# Patient Record
Sex: Female | Born: 1950 | Race: Black or African American | Hispanic: No | Marital: Single | Smoking: Current every day smoker
Health system: Southern US, Community
[De-identification: ages and names within clinical notes are randomized; demographics above are authoritative.]

## PROBLEM LIST (undated history)

## (undated) DIAGNOSIS — I10 Essential (primary) hypertension: Secondary | ICD-10-CM

## (undated) DIAGNOSIS — J45909 Unspecified asthma, uncomplicated: Secondary | ICD-10-CM

## (undated) DIAGNOSIS — E785 Hyperlipidemia, unspecified: Secondary | ICD-10-CM

## (undated) DIAGNOSIS — M79604 Pain in right leg: Secondary | ICD-10-CM

## (undated) DIAGNOSIS — M549 Dorsalgia, unspecified: Secondary | ICD-10-CM

## (undated) DIAGNOSIS — E119 Type 2 diabetes mellitus without complications: Secondary | ICD-10-CM

## (undated) DIAGNOSIS — E559 Vitamin D deficiency, unspecified: Secondary | ICD-10-CM

## (undated) DIAGNOSIS — G629 Polyneuropathy, unspecified: Secondary | ICD-10-CM

## (undated) HISTORY — DX: Vitamin D deficiency, unspecified: E55.9

## (undated) HISTORY — DX: Hyperlipidemia, unspecified: E78.5

## (undated) HISTORY — DX: Polyneuropathy, unspecified: G62.9

## (undated) HISTORY — DX: Pain in right leg: M79.604

## (undated) HISTORY — PX: TONSILECTOMY, ADENOIDECTOMY, BILATERAL MYRINGOTOMY AND TUBES: SHX2538

## (undated) HISTORY — DX: Unspecified asthma, uncomplicated: J45.909

## (undated) HISTORY — DX: Type 2 diabetes mellitus without complications: E11.9

## (undated) HISTORY — PX: TUBAL LIGATION: SHX77

## (undated) HISTORY — DX: Essential (primary) hypertension: I10

## (undated) HISTORY — DX: Dorsalgia, unspecified: M54.9

---

## 2008-02-01 ENCOUNTER — Ambulatory Visit: Payer: Self-pay | Admitting: Internal Medicine

## 2008-05-16 ENCOUNTER — Ambulatory Visit: Payer: Self-pay | Admitting: Gastroenterology

## 2009-11-05 IMAGING — US ABDOMEN ULTRASOUND
1 series · 17 of 25 positions shown · non-contrast
Comparison: none

REASON FOR EXAM: Abdominal distention, pain
COMMENTS:

[Series 1: abdomen ultrasound · 17 of 40 slices shown]
[im 1/40]
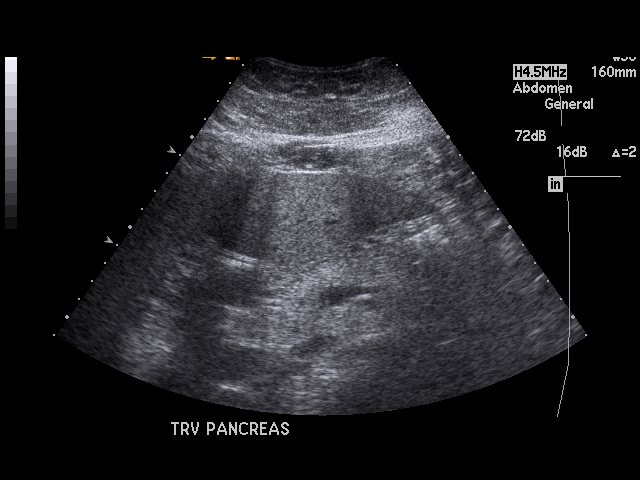
[im 4/40]
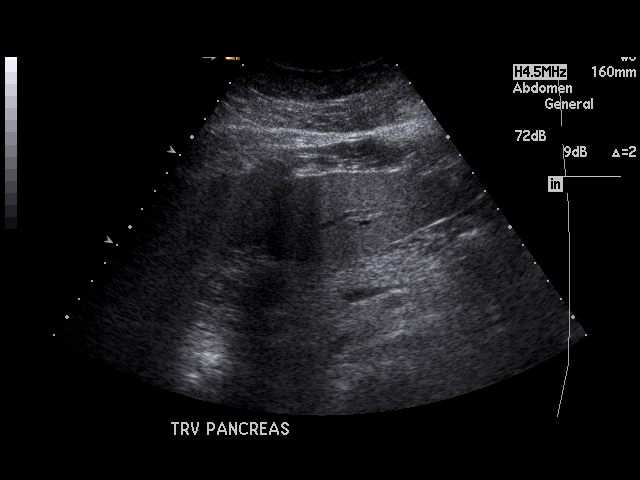
[im 5/40]
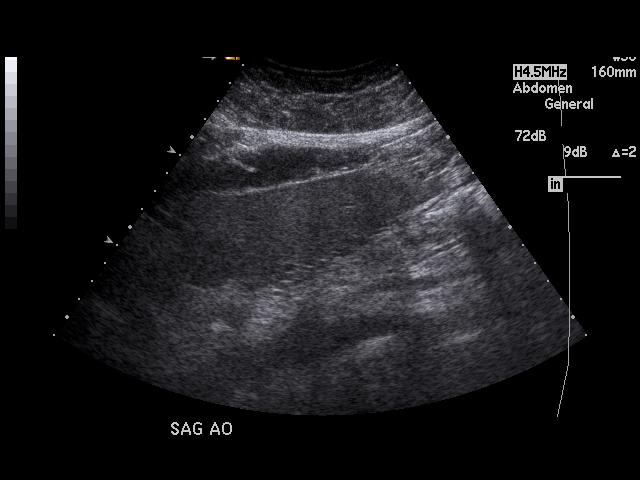
[im 9/40]
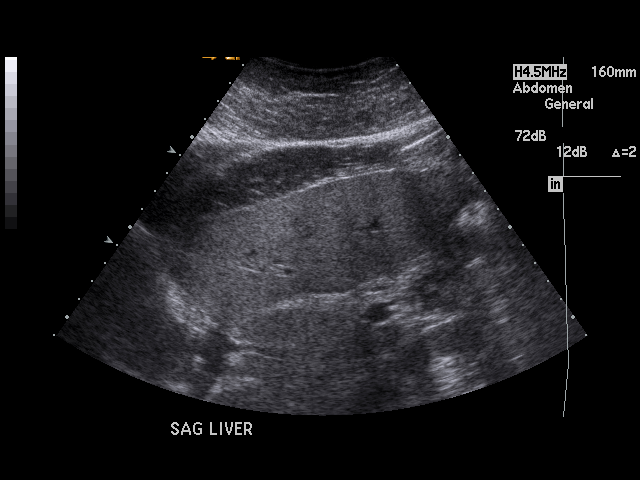
[im 10/40]
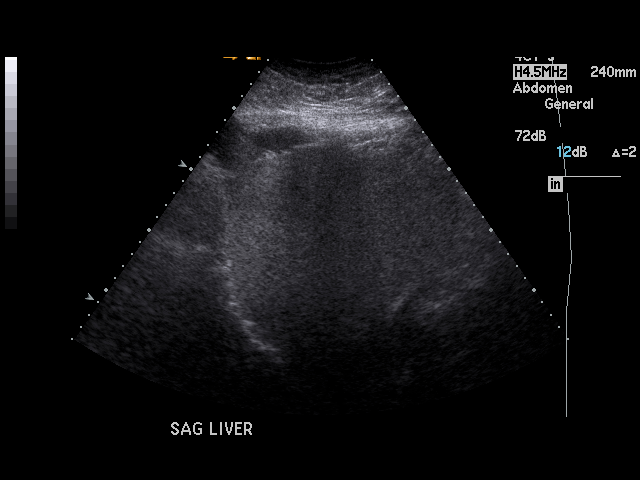
[im 14/40]
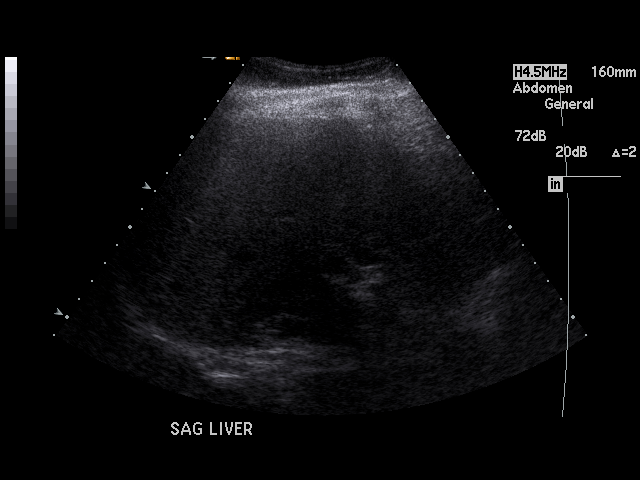
[im 15/40]
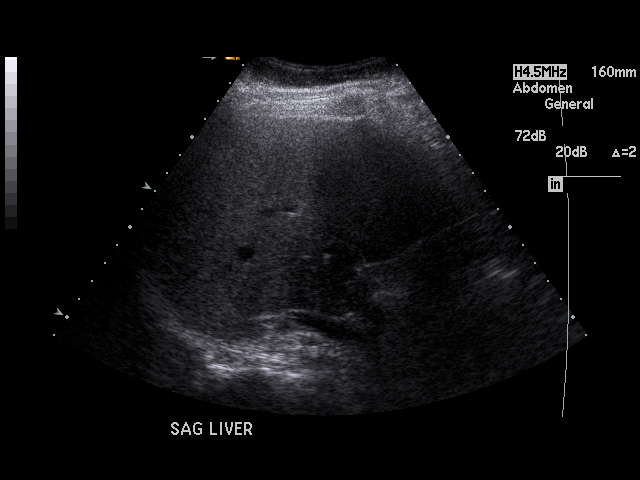
[im 18/40]
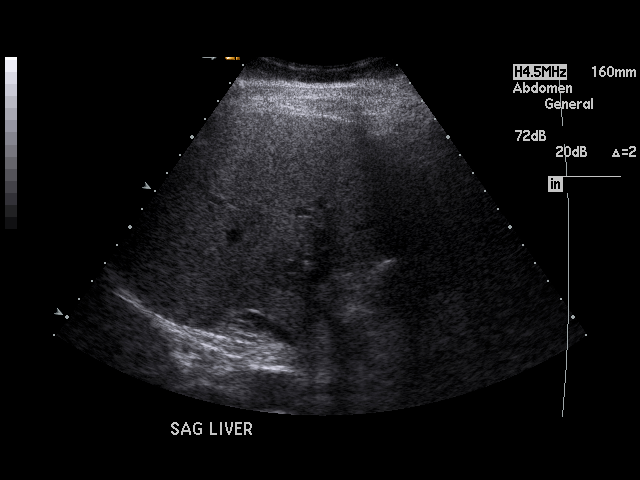
[im 20/40]
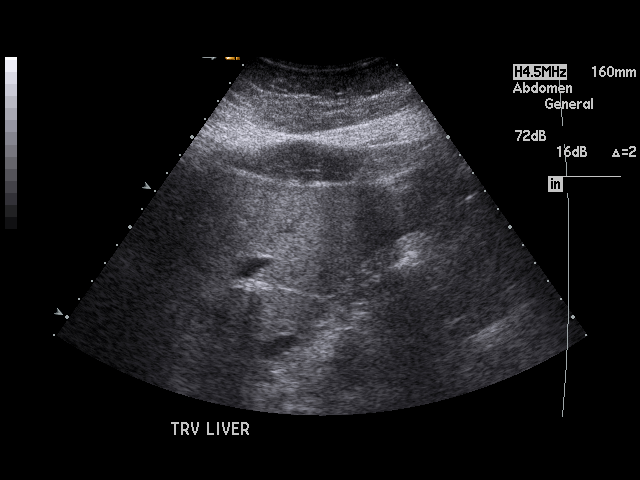
[im 22/40]
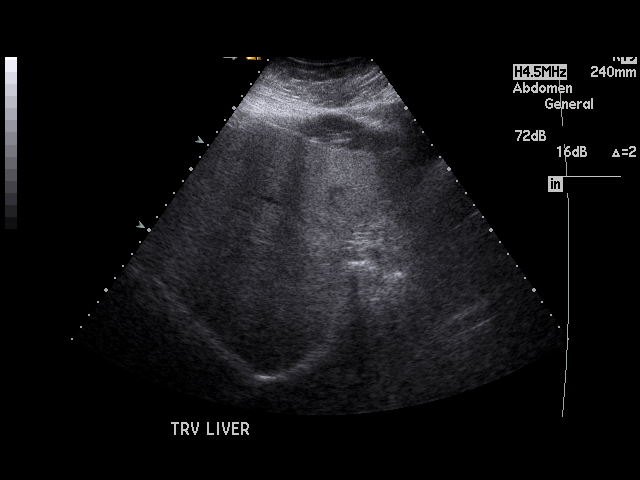
[im 25/40]
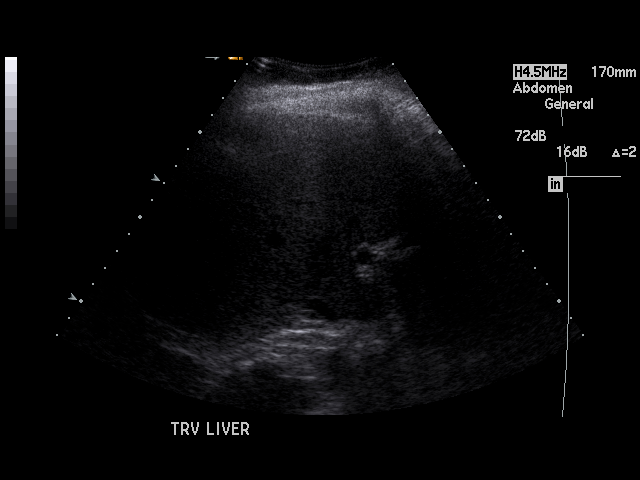
[im 27/40]
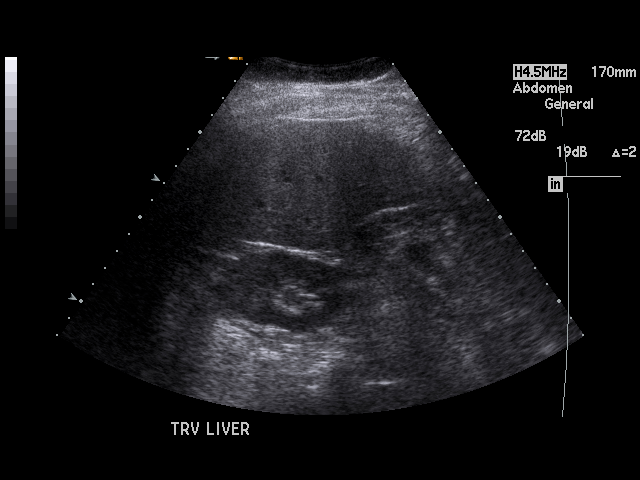
[im 30/40]
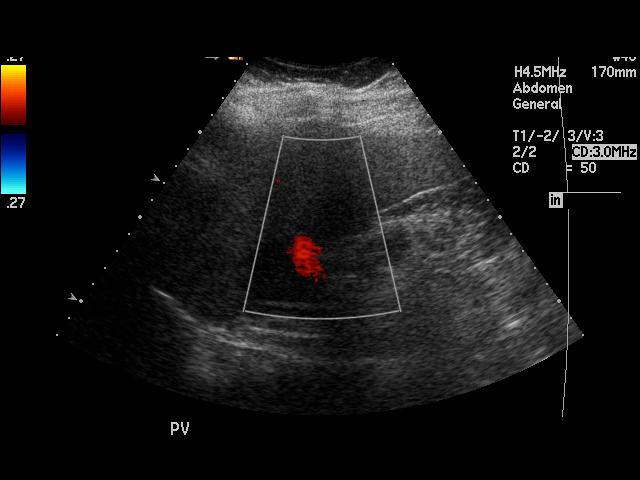
[im 31/40]
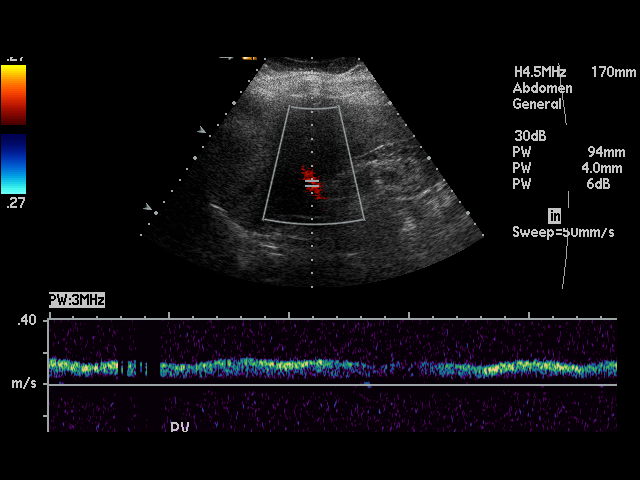
[im 35/40]
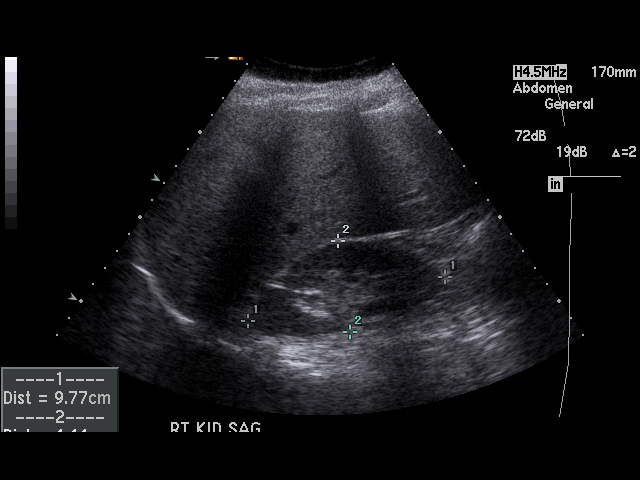
[im 36/40]
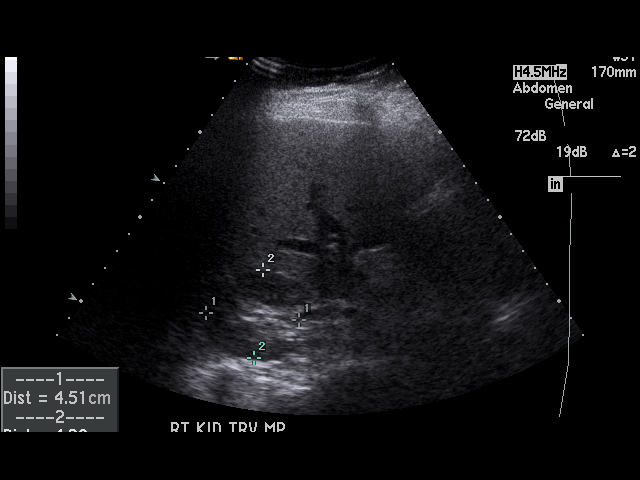
[im 40/40]
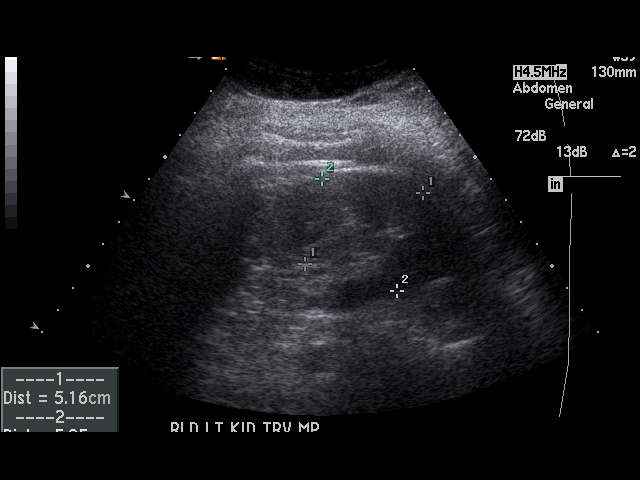

[17 of 25 positions shown; findings below may reference images not displayed]

PROCEDURE:     US  - US ABDOMEN GENERAL SURVEY  - February 01, 2008  [DATE]

RESULT:     The liver, spleen, abdominal aorta and inferior vena cava show
no significant abnormalities. The body of the pancreas is visualized and is
normal in appearance but the head and tail are partially obscured. The
patient is status post cholecystectomy. The common bile duct measures 6.2 mm
in diameter which is within normal limits. The kidneys show no
hydronephrosis. There is no ascites.
IMPRESSION: 1.  No acute changes are identified.
2.  The patient is status post cholecystectomy.
3.  The pancreas is partially obscured on this exam.
4.  There is no ascites.

## 2011-03-16 ENCOUNTER — Ambulatory Visit: Payer: Self-pay | Admitting: General Surgery

## 2011-03-18 LAB — PATHOLOGY REPORT

## 2016-09-27 ENCOUNTER — Telehealth: Payer: Self-pay

## 2016-09-27 NOTE — Telephone Encounter (Signed)
[phone] PATIENT RECEIVED LETTER TO SCHEDULE TCS

## 2016-09-30 NOTE — Telephone Encounter (Signed)
I called pt and she had a positive cologuard test. She has OV with Walden Field, NP on 10/20/2016 at 11:00 Am.

## 2016-10-20 ENCOUNTER — Encounter: Payer: Self-pay | Admitting: Nurse Practitioner

## 2016-10-20 ENCOUNTER — Ambulatory Visit (INDEPENDENT_AMBULATORY_CARE_PROVIDER_SITE_OTHER): Payer: Medicare Other | Admitting: Nurse Practitioner

## 2016-10-20 ENCOUNTER — Other Ambulatory Visit: Payer: Self-pay

## 2016-10-20 DIAGNOSIS — R634 Abnormal weight loss: Secondary | ICD-10-CM | POA: Diagnosis not present

## 2016-10-20 DIAGNOSIS — R195 Other fecal abnormalities: Secondary | ICD-10-CM | POA: Diagnosis not present

## 2016-10-20 NOTE — Progress Notes (Addendum)
REVIEWED-NO ADDITIONAL RECOMMENDATIONS.  Primary Care Physician:  Barry Dienes, NP Primary Gastroenterologist:  Dr. Oneida Alar  Chief Complaint  Patient presents with  . Weight Loss  . Colonoscopy    thinks last tcs was 2007, had +Cologuard    HPI:   Valerie Riley is a 66 y.o. female who presents on referral for +Cologuard. No noted in our system of previous colonoscopy. PCP notes reviewed which indicate positive Cologuard. Labs reviewed as well which showed normal hemoglobin at 12.6, normal platelets 254..  Today she states she's feeling just fine overall. Thinks last colonoscopy was 2007 in [place] at [facility]. Did home Cologuard test which was found to be positive. Denies abdominal pain, N/V, hematochezia, melena. Has had some unintentional weight loss of about 15-18 pounds in the last 3 months. Denies acute changes in bowel habits, consistency, and caliber. Denies chest pain, dyspnea, dizziness, lightheadedness, syncope, near syncope. Denies any other upper or lower GI symptoms.  Past Medical History:  Diagnosis Date  . Asthma   . Back pain   . Hyperlipidemia   . Hypertension   . Neuropathy   . Pain in right leg   . Type 2 diabetes mellitus ([place])   . Vitamin D deficiency     Past Surgical History:  Procedure Laterality Date  . TONSILECTOMY, ADENOIDECTOMY, BILATERAL MYRINGOTOMY AND TUBES    . TUBAL LIGATION      Current Outpatient Prescriptions  Medication Sig Dispense Refill  . BYDUREON 2 MG PEN Inject 5 mg into the skin once a week.    . Cetirizine HCl 10 MG CAPS Take 10 mg by mouth daily.    . furosemide (LASIX) 20 MG tablet Take 20 mg by mouth daily.    Marland Kitchen gabapentin (NEURONTIN) 300 MG capsule Take 300 mg by mouth 2 (two) times daily.    Marland Kitchen LANTUS SOLOSTAR 100 UNIT/ML Solostar Pen Inject 70 Units into the skin at bedtime.    Marland Kitchen losartan (COZAAR) 100 MG tablet Take 100 mg by mouth daily.    Marland Kitchen NOVOLOG FLEXPEN 100 UNIT/ML FlexPen Inject 30 Units into the skin 3  (three) times daily.    . rosuvastatin (CRESTOR) 40 MG tablet Take 40 mg by mouth daily.     No current facility-administered medications for this visit.     Allergies as of 10/20/2016 - Review Complete 10/20/2016  Allergen Reaction Noted  . Ampicillin  04/08/2015  . Cinnamon Hives 07/26/2014  . Eggs or egg-derived products  04/08/2015    Family History  Problem Relation Age of Onset  . Colon cancer Neg Hx     Social History   Social History  . Marital status: Single    Spouse name: N/A  . Number of children: N/A  . Years of education: N/A   Occupational History  . Not on file.   Social History Main Topics  . Smoking status: Current Every Day Smoker    Packs/day: 1.00    Years: 48.00    Types: Cigarettes  . Smokeless tobacco: Never Used  . Alcohol use No  . Drug use: No  . Sexual activity: Not on file   Other Topics Concern  . Not on file   Social History Narrative  . No narrative on file    Review of Systems: Complete ROS negative except as per HPI.    Physical Exam: BP (!) 144/86   Pulse 82   Temp 98.1 F (36.7 C) (Oral)   Ht 5\' 2"  (1.575 m)  Wt 146 lb 12.8 oz (66.6 kg)   BMI 26.85 kg/m  General:   Alert and oriented. Pleasant and cooperative. Well-nourished and well-developed.  Head:  Normocephalic and atraumatic. Eyes:  Without icterus, sclera clear and conjunctiva pink.  Ears:  Normal auditory acuity. Cardiovascular:  S1, S2 present without murmurs appreciated. Extremities without clubbing or edema. Respiratory:  Clear to auscultation bilaterally. No wheezes, rales, or rhonchi. No distress.  Gastrointestinal:  +BS, soft, non-tender and non-distended. No HSM noted. No guarding or rebound. No masses appreciated.  Rectal:  Deferred  Musculoskalatal:  Symmetrical without gross deformities. Neurologic:  Alert and oriented x4;  grossly normal neurologically. Psych:  Alert and cooperative. Normal mood and affect. Heme/Lymph/Immune: No excessive  bruising noted.    10/20/2016 4:16 PM   Disclaimer: This note was dictated with voice recognition software. Similar sounding words can inadvertently be transcribed and may not be corrected upon review.

## 2016-10-20 NOTE — Assessment & Plan Note (Signed)
The patient notes unintentional weight loss of about 15-20 pounds in the past 2-3 months. She stated twice she is not actively trying to lose weight. In addition to her positive fecal colon cancer screening test is concerning. Colonoscopy as per above. Return for follow-up based on postprocedure recommendations.

## 2016-10-20 NOTE — Patient Instructions (Signed)
1. We will schedule your procedure for you. 2. We will try to do your procedure with a low volume prep. 3. Further recommendations to be made after your procedure. 4. Return for follow-up based on recommendations made after your procedure.

## 2016-10-20 NOTE — Assessment & Plan Note (Addendum)
Last colonoscopy 2007. The patient underwent acholic our test with primary care which came back positive. In addition to her unintentional weight loss is concerning. We will schedule colonoscopy to further evaluate. Return for follow-up based on postprocedure recommendations. We will request her previous colonoscopy completed at [facility] in 2007.  Proceed with colonoscopy with Dr. Oneida Alar in the near future. The risks, benefits, and alternatives have been discussed in detail with the patient. They state understanding and desire to proceed.   The patient is not on any anticoagulants, anxiolytics, chronic pain medications, or antidepressants. Denies alcohol and drug use. Conscious sedation should be adequate for her procedure.  She is on multiple diabetes medicines including Bydureon, NovoLog, Lantus. I will have her hold her and indications the morning of her procedure and only take a half dose the night before her procedure.

## 2016-10-22 NOTE — Progress Notes (Signed)
CC'D TO PCP °

## 2016-12-03 ENCOUNTER — Encounter (HOSPITAL_COMMUNITY): Payer: Self-pay | Admitting: *Deleted

## 2016-12-03 ENCOUNTER — Ambulatory Visit (HOSPITAL_COMMUNITY)
Admission: RE | Admit: 2016-12-03 | Discharge: 2016-12-03 | Disposition: A | Discharge status: Home / Self Care | Payer: Medicare Other | Source: Ambulatory Visit | Attending: Gastroenterology | Admitting: Gastroenterology

## 2016-12-03 ENCOUNTER — Encounter (HOSPITAL_COMMUNITY)
Admission: RE | Disposition: A | Discharge status: Home / Self Care | Payer: Self-pay | Source: Ambulatory Visit | Attending: Gastroenterology

## 2016-12-03 DIAGNOSIS — K648 Other hemorrhoids: Secondary | ICD-10-CM | POA: Insufficient documentation

## 2016-12-03 DIAGNOSIS — D122 Benign neoplasm of ascending colon: Secondary | ICD-10-CM

## 2016-12-03 DIAGNOSIS — J45909 Unspecified asthma, uncomplicated: Secondary | ICD-10-CM | POA: Diagnosis not present

## 2016-12-03 DIAGNOSIS — E119 Type 2 diabetes mellitus without complications: Secondary | ICD-10-CM | POA: Insufficient documentation

## 2016-12-03 DIAGNOSIS — F17210 Nicotine dependence, cigarettes, uncomplicated: Secondary | ICD-10-CM | POA: Insufficient documentation

## 2016-12-03 DIAGNOSIS — Z794 Long term (current) use of insulin: Secondary | ICD-10-CM | POA: Diagnosis not present

## 2016-12-03 DIAGNOSIS — Z880 Allergy status to penicillin: Secondary | ICD-10-CM | POA: Insufficient documentation

## 2016-12-03 DIAGNOSIS — D124 Benign neoplasm of descending colon: Secondary | ICD-10-CM | POA: Insufficient documentation

## 2016-12-03 DIAGNOSIS — K5730 Diverticulosis of large intestine without perforation or abscess without bleeding: Secondary | ICD-10-CM | POA: Diagnosis not present

## 2016-12-03 DIAGNOSIS — E785 Hyperlipidemia, unspecified: Secondary | ICD-10-CM | POA: Diagnosis not present

## 2016-12-03 DIAGNOSIS — D369 Benign neoplasm, unspecified site: Secondary | ICD-10-CM

## 2016-12-03 DIAGNOSIS — Z91018 Allergy to other foods: Secondary | ICD-10-CM | POA: Diagnosis not present

## 2016-12-03 DIAGNOSIS — I10 Essential (primary) hypertension: Secondary | ICD-10-CM | POA: Insufficient documentation

## 2016-12-03 DIAGNOSIS — Z91012 Allergy to eggs: Secondary | ICD-10-CM | POA: Insufficient documentation

## 2016-12-03 DIAGNOSIS — D123 Benign neoplasm of transverse colon: Secondary | ICD-10-CM | POA: Diagnosis not present

## 2016-12-03 DIAGNOSIS — E559 Vitamin D deficiency, unspecified: Secondary | ICD-10-CM | POA: Diagnosis not present

## 2016-12-03 DIAGNOSIS — Z79899 Other long term (current) drug therapy: Secondary | ICD-10-CM | POA: Diagnosis not present

## 2016-12-03 DIAGNOSIS — R195 Other fecal abnormalities: Secondary | ICD-10-CM | POA: Diagnosis present

## 2016-12-03 DIAGNOSIS — R634 Abnormal weight loss: Secondary | ICD-10-CM

## 2016-12-03 HISTORY — PX: BIOPSY: SHX5522

## 2016-12-03 HISTORY — PX: POLYPECTOMY: SHX5525

## 2016-12-03 HISTORY — PX: COLONOSCOPY: SHX5424

## 2016-12-03 LAB — GLUCOSE, CAPILLARY: GLUCOSE-CAPILLARY: 86 mg/dL (ref 65–99)

## 2016-12-03 SURGERY — COLONOSCOPY
Anesthesia: Moderate Sedation

## 2016-12-03 MED ORDER — MEPERIDINE HCL 100 MG/ML IJ SOLN
INTRAMUSCULAR | Status: DC | PRN
  Administered 2016-12-03: 25 mg via INTRAVENOUS

## 2016-12-03 MED ORDER — MIDAZOLAM HCL 5 MG/5ML IJ SOLN
INTRAMUSCULAR | Status: DC | PRN
  Administered 2016-12-03: 2 mg via INTRAVENOUS
  Administered 2016-12-03: 1 mg via INTRAVENOUS

## 2016-12-03 MED ORDER — MEPERIDINE HCL 100 MG/ML IJ SOLN
INTRAMUSCULAR | Status: AC
  Filled 2016-12-03: qty 2

## 2016-12-03 MED ORDER — DEXTROSE-NACL 5-0.9 % IV SOLN
INTRAVENOUS | Status: DC
  Administered 2016-12-03: 10:00:00 via INTRAVENOUS

## 2016-12-03 MED ORDER — STERILE WATER FOR IRRIGATION IR SOLN
Status: DC | PRN
  Administered 2016-12-03: 11:00:00

## 2016-12-03 MED ORDER — MIDAZOLAM HCL 5 MG/5ML IJ SOLN
INTRAMUSCULAR | Status: DC
Start: 2016-12-03 — End: 2016-12-03
  Filled 2016-12-03: qty 10

## 2016-12-03 MED ORDER — SODIUM CHLORIDE 0.9 % IV SOLN: INTRAVENOUS | Status: DC

## 2016-12-03 NOTE — H&P (Signed)
Primary Care Physician:  Barry Dienes, NP Primary Gastroenterologist:  Dr. Oneida Alar  Pre-Procedure History & Physical: HPI:  Valerie Riley is a 66 y.o. female here for POS COLOGUARD TEST.   Past Medical History:  Diagnosis Date  . Asthma   . Back pain   . Hyperlipidemia   . Hypertension   . Neuropathy   . Pain in right leg   . Type 2 diabetes mellitus ([place])   . Vitamin D deficiency     Past Surgical History:  Procedure Laterality Date  . TONSILECTOMY, ADENOIDECTOMY, BILATERAL MYRINGOTOMY AND TUBES    . TUBAL LIGATION      Prior to Admission medications   Medication Sig Start Date End Date Taking? Authorizing Provider  BYDUREON 2 MG PEN Inject 2 mg into the skin once a week.  08/20/16  Yes [provider]  Cetirizine HCl 10 MG CAPS Take 10 mg by mouth daily as needed (allergies).    Yes [provider]  Cholecalciferol (VITAMIN D PO) Take 1 capsule by mouth daily.   Yes [provider]  dapagliflozin propanediol (FARXIGA) 10 MG TABS tablet Take 10 mg by mouth daily. 08/20/16  Yes [provider]  furosemide (LASIX) 20 MG tablet Take 20 mg by mouth daily. 08/20/16  Yes [provider]  gabapentin (NEURONTIN) 300 MG capsule Take 300 mg by mouth 2 (two) times daily. 08/20/16  Yes [provider]  LANTUS SOLOSTAR 100 UNIT/ML Solostar Pen Inject 70 Units into the skin at bedtime. 08/20/16  Yes [provider]  losartan (COZAAR) 100 MG tablet Take 100 mg by mouth daily. 08/20/16  Yes [provider]  NOVOLOG FLEXPEN 100 UNIT/ML FlexPen Inject 30 Units into the skin 3 (three) times daily. 08/20/16  Yes [provider]  rosuvastatin (CRESTOR) 40 MG tablet Take 40 mg by mouth daily.   Yes [provider]  acetaminophen (TYLENOL) 500 MG tablet Take 1,000 mg by mouth daily as needed for moderate pain or headache.    [provider]  aspirin-acetaminophen-caffeine (EXCEDRIN MIGRAINE) [phone] MG tablet  Take 2 tablets by mouth daily as needed for migraine.    [provider]    Allergies as of 10/20/2016 - Review Complete 10/20/2016  Allergen Reaction Noted  . Ampicillin  04/08/2015  . Cinnamon Hives 07/26/2014  . Eggs or egg-derived products  04/08/2015    Family History  Problem Relation Age of Onset  . Colon cancer Neg Hx     Social History   Social History  . Marital status: Single    Spouse name: N/A  . Number of children: N/A  . Years of education: N/A   Occupational History  . Not on file.   Social History Main Topics  . Smoking status: Current Every Day Smoker    Packs/day: 1.00    Years: 48.00    Types: Cigarettes  . Smokeless tobacco: Never Used  . Alcohol use No  . Drug use: No  . Sexual activity: Not on file   Other Topics Concern  . Not on file   Social History Narrative  . No narrative on file    Review of Systems: See HPI, otherwise negative ROS   Physical Exam: BP 122/77   Pulse 77   Temp 98.7 F (37.1 C) (Oral)   Resp 16   Ht 5\' 2"  (1.575 m)   Wt 145 lb (65.8 kg)   SpO2 99%   BMI 26.52 kg/m  General:   Alert,  pleasant and cooperative in NAD Head:  Normocephalic and atraumatic. Neck:  Supple; Lungs:  Clear throughout to auscultation.    Heart:  Regular rate and rhythm. Abdomen:  Soft, nontender and nondistended. Normal bowel sounds, without guarding, and without rebound.   Neurologic:  Alert and  oriented x4;  grossly normal neurologically.  Impression/Plan:    POS COLOGUARD TEST.   PLAN: 1. TCS TODAY.  DISCUSSED PROCEDURE, BENEFITS, & RISKS: < 1% chance of medication reaction, bleeding, perforation, or rupture of spleen/liver.

## 2016-12-03 NOTE — Discharge Instructions (Signed)
You have moderate internal hemorrhoids, WHICH can CAUSE RECTAL BLEEDING. YOU HAVE diverticulosis IN YOUR LEFT COLON. YOU HAD THREE SMALL POLYPS REMOVED.    DRINK WATER TO KEEP YOUR URINE LIGHT YELLOW.  FOLLOW A HIGH FIBER DIET. AVOID ITEMS THAT CAUSE BLOATING. See info below.  USE PREPARATION H FOUR TIMES  A DAY IF NEEDED TO RELIEVE RECTAL PAIN/PRESSURE/BLEEDING.   MY OFFICE WILL CONTACT YOU IN 14 DAYS WITH YOUR RESULTS.   Next colonoscopy in 3 YEARS. Colonoscopy Care After Read the instructions outlined below and refer to this sheet in the next week. These discharge instructions provide you with general information on caring for yourself after you leave the hospital. While your treatment has been planned according to the most current medical practices available, unavoidable complications occasionally occur. If you have any problems or questions after discharge, call DR. Seini Lannom, [phone].  ACTIVITY  You may resume your regular activity, but move at a slower pace for the next 24 hours.   Take frequent rest periods for the next 24 hours.   Walking will help get rid of the air and reduce the bloated feeling in your belly (abdomen).   No driving for 24 hours (because of the medicine (anesthesia) used during the test).   You may shower.   Do not sign any important legal documents or operate any machinery for 24 hours (because of the anesthesia used during the test).    NUTRITION  Drink plenty of fluids.   You may resume your normal diet as instructed by your doctor.   Begin with a light meal and progress to your normal diet. Heavy or fried foods are harder to digest and may make you feel sick to your stomach (nauseated).   Avoid alcoholic beverages for 24 hours or as instructed.    MEDICATIONS  You may resume your normal medications.   WHAT YOU CAN EXPECT TODAY  Some feelings of bloating in the abdomen.   Passage of more gas than usual.   Spotting of blood in  your stool or on the toilet paper  .  IF YOU HAD POLYPS REMOVED DURING THE COLONOSCOPY:  Eat a soft diet IF YOU HAVE NAUSEA, BLOATING, ABDOMINAL PAIN, OR VOMITING.    FINDING OUT THE RESULTS OF YOUR TEST Not all test results are available during your visit. DR. Oneida Alar WILL CALL YOU WITHIN 14 DAYS OF YOUR PROCEDUE WITH YOUR RESULTS. Do not assume everything is normal if you have not heard from DR. Dorsey Authement, CALL HER OFFICE AT [phone].  SEEK IMMEDIATE MEDICAL ATTENTION AND CALL THE OFFICE: [phone] IF:  You have more than a spotting of blood in your stool.   Your belly is swollen (abdominal distention).   You are nauseated or vomiting.   You have a temperature over 101F.   You have abdominal pain or discomfort that is severe or gets worse throughout the day.  High-Fiber Diet A high-fiber diet changes your normal diet to include more whole grains, legumes, fruits, and vegetables. Changes in the diet involve replacing refined carbohydrates with unrefined foods. The calorie level of the diet is essentially unchanged. The Dietary Reference Intake (recommended amount) for adult males is 38 grams per day. For adult females, it is 25 grams per day. Pregnant and lactating women should consume 28 grams of fiber per day. Fiber is the intact part of a plant that is not broken down during digestion. Functional fiber is fiber that has been isolated from the plant to provide a beneficial  effect in the body. PURPOSE  Increase stool bulk.   Ease and regulate bowel movements.   Lower cholesterol.  REDUCE RISK OF COLON CANCER  INDICATIONS THAT YOU NEED MORE FIBER  Constipation and hemorrhoids.   Uncomplicated diverticulosis (intestine condition) and irritable bowel syndrome.   Weight management.   As a protective measure against hardening of the arteries (atherosclerosis), diabetes, and cancer.   GUIDELINES FOR INCREASING FIBER IN THE DIET  Start adding fiber to the diet slowly. A  gradual increase of about 5 more grams (2 slices of whole-wheat bread, 2 servings of most fruits or vegetables, or 1 bowl of high-fiber cereal) per day is best. Too rapid an increase in fiber may result in constipation, flatulence, and bloating.   Drink enough water and fluids to keep your urine clear or pale yellow. Water, juice, or caffeine-free drinks are recommended. Not drinking enough fluid may cause constipation.   Eat a variety of high-fiber foods rather than one type of fiber.   Try to increase your intake of fiber through using high-fiber foods rather than fiber pills or supplements that contain small amounts of fiber.   The goal is to change the types of food eaten. Do not supplement your present diet with high-fiber foods, but replace foods in your present diet.   INCLUDE A VARIETY OF FIBER SOURCES  Replace refined and processed grains with whole grains, canned fruits with fresh fruits, and incorporate other fiber sources. White rice, white breads, and most bakery goods contain little or no fiber.   Brown whole-grain rice, buckwheat oats, and many fruits and vegetables are all good sources of fiber. These include: broccoli, Brussels sprouts, cabbage, cauliflower, beets, sweet potatoes, white potatoes (skin on), carrots, tomatoes, eggplant, squash, berries, fresh fruits, and dried fruits.   Cereals appear to be the richest source of fiber. Cereal fiber is found in whole grains and bran. Bran is the fiber-rich outer coat of cereal grain, which is largely removed in refining. In whole-grain cereals, the bran remains. In breakfast cereals, the largest amount of fiber is found in those with "bran" in their names. The fiber content is sometimes indicated on the label.   You may need to include additional fruits and vegetables each day.   In baking, for 1 cup white flour, you may use the following substitutions:   1 cup whole-wheat flour minus 2 tablespoons.   1/2 cup white flour plus  1/2 cup whole-wheat flour.   Polyps, Colon  A polyp is extra tissue that grows inside your body. Colon polyps grow in the large intestine. The large intestine, also called the colon, is part of your digestive system. It is a long, hollow tube at the end of your digestive tract where your body makes and stores stool. Most polyps are not dangerous. They are benign. This means they are not cancerous. But over time, some types of polyps can turn into cancer. Polyps that are smaller than a pea are usually not harmful. But larger polyps could someday become or may already be cancerous. To be safe, doctors remove all polyps and test them.   PREVENTION There is not one sure way to prevent polyps. You might be able to lower your risk of getting them if you:  Eat more fruits and vegetables and less fatty food.   Do not smoke.   Avoid alcohol.   Exercise every day.   Lose weight if you are overweight.   Eating more calcium and folate can also lower  your risk of getting polyps. Some foods that are rich in calcium are milk, cheese, and broccoli. Some foods that are rich in folate are chickpeas, kidney beans, and spinach.    Diverticulosis Diverticulosis is a common condition that develops when small pouches (diverticula) form in the wall of the colon. The risk of diverticulosis increases with age. It happens more often in people who eat a low-fiber diet. Most individuals with diverticulosis have no symptoms. Those individuals with symptoms usually experience belly (abdominal) pain, constipation, or loose stools (diarrhea).  HOME CARE INSTRUCTIONS  Increase the amount of fiber in your diet as directed by your caregiver or dietician. This may reduce symptoms of diverticulosis.   Drink at least 6 to 8 glasses of water each day to prevent constipation.   Try not to strain when you have a bowel movement.   Avoiding nuts and seeds to prevent complications is NOT NECESSARY.     FOODS HAVING HIGH  FIBER CONTENT INCLUDE:  Fruits. Apple, peach, pear, tangerine, raisins, prunes.   Vegetables. Brussels sprouts, asparagus, broccoli, cabbage, carrot, cauliflower, romaine lettuce, spinach, summer squash, tomato, winter squash, zucchini.   Starchy Vegetables. Baked beans, kidney beans, lima beans, split peas, lentils, potatoes (with skin).   Grains. Whole wheat bread, brown rice, bran flake cereal, plain oatmeal, white rice, shredded wheat, bran muffins.    SEEK IMMEDIATE MEDICAL CARE IF:  You develop increasing pain or severe bloating.   You have an oral temperature above 101F.   You develop vomiting or bowel movements that are bloody or black.   Hemorrhoids Hemorrhoids are dilated (enlarged) veins around the rectum. Sometimes clots will form in the veins. This makes them swollen and painful. These are called thrombosed hemorrhoids. Causes of hemorrhoids include:  Constipation.   Straining to have a bowel movement.   HEAVY LIFTING  HOME CARE INSTRUCTIONS  Eat a well balanced diet and drink 6 to 8 glasses of water every day to avoid constipation. You may also use a bulk laxative.   Avoid straining to have bowel movements.   Keep anal area dry and clean.   Do not use a donut shaped pillow or sit on the toilet for long periods. This increases blood pooling and pain.   Move your bowels when your body has the urge; this will require less straining and will decrease pain and pressure.

## 2016-12-03 NOTE — Op Note (Signed)
[facility] Patient Name: Valerie Riley Procedure Date: 12/03/2016 10:56 AM MRN: [id number] Date of Birth: Apr 20, 1951 Attending MD: Barney Drain , MD CSN: [id number] Age: 66 Admit Type: Outpatient Procedure:                Colonoscopy WITH COLD FORCEPS POLYPECTOMY Indications:              Positive Cologuard test Providers:                Barney Drain, MD, Otis Peak B. Sharon Seller, RN, Randa Spike, Technician Referring MD:             Barry Dienes Medicines:                Meperidine 25 mg IV, Midazolam 3 mg IV Complications:            No immediate complications. Estimated Blood Loss:     Estimated blood loss was minimal. Procedure:                Pre-Anesthesia Assessment:                           - Prior to the procedure, a History and Physical                            was performed, and patient medications and                            allergies were reviewed. The patient's tolerance of                            previous anesthesia was also reviewed. The risks                            and benefits of the procedure and the sedation                            options and risks were discussed with the patient.                            All questions were answered, and informed consent                            was obtained. Prior Anticoagulants: The patient has                            taken aspirin, last dose was 1 day prior to                            procedure. ASA Grade Assessment: II - A patient                            with mild systemic disease. After reviewing the  risks and benefits, the patient was deemed in                            satisfactory condition to undergo the procedure.                            After obtaining informed consent, the colonoscope                            was passed under direct vision. Throughout the                            procedure, the patient's blood pressure, pulse, and                             oxygen saturations were monitored continuously. The                            EC-[id number] ([id number]) scope was introduced through                            the anus and advanced to the the cecum, identified                            by appendiceal orifice and ileocecal valve. The                            colonoscopy was somewhat difficult due to a                            tortuous colon. Successful completion of the                            procedure was aided by straightening and shortening                            the scope to obtain bowel loop reduction and                            COLOWRAP. The patient tolerated the procedure                            fairly well. The quality of the bowel preparation                            was good. The ileocecal valve, appendiceal orifice,                            and rectum were photographed. Scope In: 11:22:55 AM Scope Out: 11:44:07 AM Scope Withdrawal Time: 0 hours 17 minutes 3 seconds  Total Procedure Duration: 0 hours 21 minutes 12 seconds  Findings:      Three sessile polyps were found in the splenic flexure, mid transverse       colon  and ascending colon. The polyps were 2 to 4 mm in size. These       polyps were removed with a cold biopsy forceps. Resection and retrieval       were complete.      Multiple small and large-mouthed diverticula were found in the       recto-sigmoid colon, sigmoid colon and distal ascending colon.      Internal hemorrhoids were found during retroflexion. The hemorrhoids       were moderate. Impression:               - Three 2 to 4 mm polyps at the splenic flexure, in                            the mid transverse colon and in the ascending                            colon, removed with a cold biopsy forceps. Resected                            and retrieved.                           - Diverticulosis in the recto-sigmoid colon, in the                            sigmoid  colon and in the distal ascending colon.                           - Internal hemorrhoids. Moderate Sedation:      Moderate (conscious) sedation was administered by the endoscopy nurse       and supervised by the endoscopist. The following parameters were       monitored: oxygen saturation, heart rate, blood pressure, and response       to care. Total physician intraservice time was 31 minutes. Recommendation:           - Repeat colonoscopy in 3 years for surveillance.                           - High fiber diet.                           - Continue present medications.                           - Patient has a contact number available for                            emergencies. The signs and symptoms of potential                            delayed complications were discussed with the                            patient. Return to normal activities tomorrow.  Written discharge instructions were provided to the                            patient. Procedure Code(s):        --- Professional ---                           [phone], Colonoscopy, flexible; with biopsy, single                            or multiple                           99152, Moderate sedation services provided by the                            same physician or other qualified health care                            professional performing the diagnostic or                            therapeutic service that the sedation supports,                            requiring the presence of an independent trained                            observer to assist in the monitoring of the                            patient's level of consciousness and physiological                            status; initial 15 minutes of intraservice time,                            patient age 55 years or older                           [id number], Moderate sedation services; each additional                            15 minutes  intraservice time Diagnosis Code(s):        --- Professional ---                           D12.3, Benign neoplasm of transverse colon (hepatic                            flexure or splenic flexure)                           D12.2, Benign neoplasm of ascending colon  K64.8, Other hemorrhoids                           R19.5, Other fecal abnormalities                           K57.30, Diverticulosis of large intestine without                            perforation or abscess without bleeding CPT copyright 2016 [employer]. All rights reserved. The codes documented in this report are preliminary and upon coder review may  be revised to meet current compliance requirements. Barney Drain, MD Barney Drain, MD 12/03/2016 12:03:46 PM This report has been signed electronically. Number of Addenda: 0

## 2016-12-08 ENCOUNTER — Telehealth: Payer: Self-pay | Admitting: Gastroenterology

## 2016-12-08 NOTE — Telephone Encounter (Signed)
Please call pt. She had TWO simple adenomas AND ONE BENIGN POLYPOID LESION removed. NEXT COLONOSCOPY IN 5 YEARS.

## 2016-12-09 NOTE — Telephone Encounter (Signed)
Phone number on file is not working

## 2016-12-09 NOTE — Telephone Encounter (Signed)
Reminder in epic °

## 2016-12-10 ENCOUNTER — Encounter (HOSPITAL_COMMUNITY): Payer: Self-pay | Admitting: Gastroenterology

## 2017-10-14 ENCOUNTER — Encounter (HOSPITAL_COMMUNITY): Payer: Self-pay | Admitting: Emergency Medicine

## 2017-10-14 ENCOUNTER — Emergency Department (HOSPITAL_COMMUNITY)
Admission: EM | Admit: 2017-10-14 | Discharge: 2017-10-14 | Disposition: A | Discharge status: Home / Self Care | Payer: Medicare HMO | Attending: Emergency Medicine | Admitting: Emergency Medicine

## 2017-10-14 ENCOUNTER — Other Ambulatory Visit: Payer: Self-pay

## 2017-10-14 DIAGNOSIS — F17210 Nicotine dependence, cigarettes, uncomplicated: Secondary | ICD-10-CM | POA: Insufficient documentation

## 2017-10-14 DIAGNOSIS — J45909 Unspecified asthma, uncomplicated: Secondary | ICD-10-CM | POA: Insufficient documentation

## 2017-10-14 DIAGNOSIS — I10 Essential (primary) hypertension: Secondary | ICD-10-CM | POA: Diagnosis present

## 2017-10-14 DIAGNOSIS — Z79899 Other long term (current) drug therapy: Secondary | ICD-10-CM | POA: Insufficient documentation

## 2017-10-14 DIAGNOSIS — E1165 Type 2 diabetes mellitus with hyperglycemia: Secondary | ICD-10-CM | POA: Insufficient documentation

## 2017-10-14 DIAGNOSIS — N390 Urinary tract infection, site not specified: Secondary | ICD-10-CM | POA: Diagnosis not present

## 2017-10-14 DIAGNOSIS — Z794 Long term (current) use of insulin: Secondary | ICD-10-CM | POA: Insufficient documentation

## 2017-10-14 DIAGNOSIS — R739 Hyperglycemia, unspecified: Secondary | ICD-10-CM

## 2017-10-14 LAB — COMPREHENSIVE METABOLIC PANEL
ALT: 39 U/L (ref 14–54)
ANION GAP: 15 (ref 5–15)
AST: 26 U/L (ref 15–41)
Albumin: 4.2 g/dL (ref 3.5–5.0)
Alkaline Phosphatase: 178 U/L — ABNORMAL HIGH (ref 38–126)
BILIRUBIN TOTAL: 0.7 mg/dL (ref 0.3–1.2)
BUN: 25 mg/dL — ABNORMAL HIGH (ref 6–20)
CHLORIDE: 99 mmol/L — AB (ref 101–111)
CO2: 22 mmol/L (ref 22–32)
Calcium: 10.1 mg/dL (ref 8.9–10.3)
Creatinine, Ser: 1.5 mg/dL — ABNORMAL HIGH (ref 0.44–1.00)
GFR, EST AFRICAN AMERICAN: 41 mL/min — AB (ref >60)
GFR, EST NON AFRICAN AMERICAN: 35 mL/min — AB (ref >60)
Glucose, Bld: 386 mg/dL — ABNORMAL HIGH (ref 65–99)
POTASSIUM: 3.7 mmol/L (ref 3.5–5.1)
Sodium: 136 mmol/L (ref 135–145)
Total Protein: 8.5 g/dL — ABNORMAL HIGH (ref 6.5–8.1)

## 2017-10-14 LAB — URINALYSIS, ROUTINE W REFLEX MICROSCOPIC
BILIRUBIN URINE: NEGATIVE
KETONES UR: NEGATIVE mg/dL
Nitrite: NEGATIVE
PH: 5 (ref 5.0–8.0)
Protein, ur: NEGATIVE mg/dL
Specific Gravity, Urine: 1.017 (ref 1.005–1.030)

## 2017-10-14 LAB — CBC WITH DIFFERENTIAL/PLATELET
BASOS ABS: 0 10*3/uL (ref 0.0–0.1)
Basophils Relative: 0 %
Eosinophils Absolute: 0.1 10*3/uL (ref 0.0–0.7)
Eosinophils Relative: 1 %
HEMATOCRIT: 42 % (ref 36.0–46.0)
Hemoglobin: 14.3 g/dL (ref 12.0–15.0)
LYMPHS PCT: 25 %
Lymphs Abs: 2 10*3/uL (ref 0.7–4.0)
MCH: 26.5 pg (ref 26.0–34.0)
MCHC: 34 g/dL (ref 30.0–36.0)
MCV: 77.8 fL — AB (ref 78.0–100.0)
MONO ABS: 0.4 10*3/uL (ref 0.1–1.0)
Monocytes Relative: 5 %
NEUTROS ABS: 5.5 10*3/uL (ref 1.7–7.7)
Neutrophils Relative %: 69 %
PLATELETS: 243 10*3/uL (ref 150–400)
RBC: 5.4 MIL/uL — ABNORMAL HIGH (ref 3.87–5.11)
RDW: 13.4 % (ref 11.5–15.5)
WBC: 8 10*3/uL (ref 4.0–10.5)

## 2017-10-14 LAB — CBG MONITORING, ED
GLUCOSE-CAPILLARY: 257 mg/dL — AB (ref 65–99)
GLUCOSE-CAPILLARY: 357 mg/dL — AB (ref 65–99)
GLUCOSE-CAPILLARY: 404 mg/dL — AB (ref 65–99)

## 2017-10-14 LAB — BLOOD GAS, VENOUS
Acid-base deficit: 0.1 mmol/L (ref 0.0–2.0)
Bicarbonate: 23.7 mmol/L (ref 20.0–28.0)
FIO2: 21
O2 SAT: 80 %
PCO2 VEN: 42.6 mmHg — AB (ref 44.0–60.0)
PO2 VEN: 44.9 mmHg (ref 32.0–45.0)
pH, Ven: 7.377 (ref 7.250–7.430)

## 2017-10-14 LAB — TROPONIN I

## 2017-10-14 MED ORDER — SODIUM CHLORIDE 0.9 % IV BOLUS
1000.0000 mL | Freq: Once | INTRAVENOUS | Status: AC
  Administered 2017-10-14: 1000 mL via INTRAVENOUS

## 2017-10-14 MED ORDER — CEPHALEXIN 500 MG PO CAPS: 500.0000 mg | ORAL_CAPSULE | Freq: Two times a day (BID) | ORAL | 0 refills | Status: AC

## 2017-10-14 NOTE — ED Triage Notes (Signed)
Per daughter, patient blood sugar and blood pressure has been reading high, 2, set of 20 units of Novolog, was at [facility], but wait was too long.

## 2017-10-14 NOTE — ED Provider Notes (Signed)
[facility] EMERGENCY DEPARTMENT Provider Note   CSN: [id number] Arrival date & time: 10/14/17  2022     History   Chief Complaint Chief Complaint  Patient presents with  . Hypertension  . Hyperglycemia    HPI Valerie Riley is a 67 y.o. female.  HPI  67 year old female with a history of type 2 diabetes and hypertension presents with hyperglycemia.  History is taken from the daughter and patient.  She has been feeling nauseated and weak for about 3 days.  Last time she checked her glucose before today was on 4/15.  She uses insulin for glucose control but states she does not check her glucose every day.  Today her glucose was over 600 at 3 PM and 6 PM.  She took 20 units of NovoLog at 3 PM and another 20 at 6 PM.  She was in an outside hospital waiting room but it was taking too long so she came here instead.  When she checked and there she was told her blood pressure was high in the 150 range.  Patient denies vomiting but has been nauseated.  She developed a headache starting last night.  She denies any focal weakness.  No chest pain or shortness of breath or abdominal pain.  Daughter has noted she has had trouble walking and needs an assistant role she is too weak.  This is new over the last couple days.  Past Medical History:  Diagnosis Date  . Asthma   . Back pain   . Hyperlipidemia   . Hypertension   . Neuropathy   . Pain in right leg   . Type 2 diabetes mellitus ([place])   . Vitamin D deficiency     Patient Active Problem List   Diagnosis Date Noted  . Adenomatous polyps   . Positive colorectal cancer screening using Cologuard test 10/20/2016  . Loss of weight 10/20/2016    Past Surgical History:  Procedure Laterality Date  . BIOPSY  12/03/2016   Procedure: BIOPSY;  Surgeon: Danie Binder, MD;  Location: [facility];  Service: Endoscopy;;  sigmoid colon  . COLONOSCOPY N/A 12/03/2016   Procedure: COLONOSCOPY;  Surgeon: Danie Binder, MD;  Location: [facility];   Service: Endoscopy;  Laterality: N/A;  1030   . POLYPECTOMY  12/03/2016   Procedure: POLYPECTOMY;  Surgeon: Danie Binder, MD;  Location: [facility];  Service: Endoscopy;;  cecal, rectal splenic flexure  . TONSILECTOMY, ADENOIDECTOMY, BILATERAL MYRINGOTOMY AND TUBES    . TUBAL LIGATION       OB History   None      Home Medications    Prior to Admission medications   Medication Sig Start Date End Date Taking? Authorizing Provider  BYDUREON 2 MG PEN Inject 2 mg into the skin every Wednesday.  08/20/16  Yes [provider]  dapagliflozin propanediol (FARXIGA) 10 MG TABS tablet Take 10 mg by mouth daily. 08/20/16  Yes [provider]  furosemide (LASIX) 20 MG tablet Take 20 mg by mouth daily. 08/20/16  Yes [provider]  gabapentin (NEURONTIN) 300 MG capsule Take 300 mg by mouth 2 (two) times daily. 08/20/16  Yes [provider]  LANTUS SOLOSTAR 100 UNIT/ML Solostar Pen Inject 70 Units into the skin at bedtime. 08/20/16  Yes [provider]  losartan (COZAAR) 100 MG tablet Take 100 mg by mouth daily. 08/20/16  Yes [provider]  NOVOLOG FLEXPEN 100 UNIT/ML FlexPen Inject 20 Units into the skin  2 (two) times daily.  08/20/16  Yes [provider]  rosuvastatin (CRESTOR) 40 MG tablet Take 40 mg by mouth daily.   Yes [provider]  cephALEXin (KEFLEX) 500 MG capsule Take 1 capsule (500 mg total) by mouth 2 (two) times daily for 5 days. 10/14/17 10/19/17  Sherwood Gambler, MD    Family History Family History  Problem Relation Age of Onset  . Colon cancer Neg Hx     Social History Social History   Tobacco Use  . Smoking status: Current Every Day Smoker    Packs/day: 1.00    Years: 48.00    Pack years: 48.00    Types: Cigarettes  . Smokeless tobacco: Never Used  Substance Use Topics  . Alcohol use: No  . Drug use: No     Allergies   Ampicillin; Cinnamon; and Eggs or egg-derived products   Review of  Systems Review of Systems  Constitutional: Positive for fatigue. Negative for fever.  Respiratory: Negative for shortness of breath.   Cardiovascular: Negative for chest pain.  Gastrointestinal: Positive for nausea. Negative for abdominal pain and vomiting.  Neurological: Positive for weakness and headaches.  All other systems reviewed and are negative.    Physical Exam Updated Vital Signs BP 116/69   Pulse 91   Temp 98.7 F (37.1 C) (Oral)   Resp (!) 21   Ht 5\' 2"  (1.575 m)   Wt 68.5 kg (151 lb)   SpO2 97%   BMI 27.62 kg/m   Physical Exam  Constitutional: She is oriented to person, place, and time. She appears well-developed and well-nourished. No distress.  HENT:  Head: Normocephalic and atraumatic.  Right Ear: External ear normal.  Left Ear: External ear normal.  Nose: Nose normal.  Eyes: Pupils are equal, round, and reactive to light. EOM are normal. Right eye exhibits no discharge. Left eye exhibits no discharge.  Cardiovascular: Normal rate, regular rhythm and normal heart sounds.  Pulmonary/Chest: Effort normal and breath sounds normal.  Abdominal: Soft. She exhibits no distension. There is no tenderness.  Neurological: She is alert and oriented to person, place, and time.  CN 3-12 grossly intact. 5/5 strength in all 4 extremities. Grossly normal sensation. Normal finger to nose.   Skin: Skin is warm and dry. She is not diaphoretic.  Nursing note and vitals reviewed.    ED Treatments / Results  Labs (all labs ordered are listed, but only abnormal results are displayed) Labs Reviewed  COMPREHENSIVE METABOLIC PANEL - Abnormal; Notable for the following components:      Result Value   Chloride 99 (*)    Glucose, Bld 386 (*)    BUN 25 (*)    Creatinine, Ser 1.50 (*)    Total Protein 8.5 (*)    Alkaline Phosphatase 178 (*)    GFR calc non Af Amer 35 (*)    GFR calc Af Amer 41 (*)    All other components within normal limits  CBC WITH DIFFERENTIAL/PLATELET -  Abnormal; Notable for the following components:   RBC 5.40 (*)    MCV 77.8 (*)    All other components within normal limits  BLOOD GAS, VENOUS - Abnormal; Notable for the following components:   pCO2, Ven 42.6 (*)    All other components within normal limits  URINALYSIS, ROUTINE W REFLEX MICROSCOPIC - Abnormal; Notable for the following components:   Color, Urine STRAW (*)    Glucose, UA >=500 (*)    Hgb urine dipstick SMALL (*)  Leukocytes, UA SMALL (*)    Bacteria, UA RARE (*)    Squamous Epithelial / LPF 0-5 (*)    All other components within normal limits  CBG MONITORING, ED - Abnormal; Notable for the following components:   Glucose-Capillary 404 (*)    All other components within normal limits  CBG MONITORING, ED - Abnormal; Notable for the following components:   Glucose-Capillary 357 (*)    All other components within normal limits  URINE CULTURE  TROPONIN I  CBG MONITORING, ED    EKG EKG Interpretation  Date/Time:  Friday October 14 2017 21:23:40 EDT Ventricular Rate:  98 PR Interval:    QRS Duration: 97 QT Interval:  338 QTC Calculation: 432 R Axis:   59 Text Interpretation:  Sinus rhythm Biatrial enlargement Borderline repolarization abnormality Confirmed by Sherwood Gambler [phone]) on 10/14/2017 9:27:42 PM   Radiology No results found.  Procedures Procedures (including critical care time)  Medications Ordered in ED Medications  sodium chloride 0.9 % bolus 1,000 mL (0 mLs Intravenous Stopped 10/14/17 2230)  sodium chloride 0.9 % bolus 1,000 mL (1,000 mLs Intravenous New Bag/Given 10/14/17 2241)     Initial Impression / Assessment and Plan / ED Course  I have reviewed the triage vital signs and the nursing notes.  Pertinent labs & imaging results that were available during my care of the patient were reviewed by me and considered in my medical decision making (see chart for details).     Patient is feeling much better after IV fluids.  Her glucose has  down trended.  Likely this is a combination of fluids as well as the insulin she gave herself prior to arrival.  Her anion gap is right at 15 but her bicarbonate is above 20 and her venous blood gas shows no acidosis.  This does not appear consistent with DKA.  Urine with borderline possible UTI with small leukocytes and 6-30 WBCs.  She does not have symptoms but otherwise given her age and immune suppression with diabetes that is poorly controlled I will cover her with antibiotics.  She otherwise is feeling much better and her neuro exam is unremarkable.  She appears stable for discharge home.  I counseled her on better glycemic control and checking her glucose more often, especially whenever she is about to give herself insulin.  Follow-up with PCP.  Return precautions.  Final Clinical Impressions(s) / ED Diagnoses   Final diagnoses:  Hyperglycemia  Acute UTI    ED Discharge Orders        Ordered    cephALEXin (KEFLEX) 500 MG capsule  2 times daily     10/14/17 2344       Sherwood Gambler, MD 10/14/17 2348

## 2017-10-14 NOTE — ED Notes (Signed)
Pt states while waiting at the [facility] facility she injected two sets of Novolog 20 units. Pt states while at the facility the glucose monitor was reading over 600. Pt blood sugar currently 404. Pt blood pressure is within normal limits. Will continue to monitor patients blood sugars and blood pressure.

## 2017-10-16 LAB — URINE CULTURE: CULTURE: NO GROWTH

## 2021-11-02 ENCOUNTER — Encounter: Payer: Self-pay | Admitting: *Deleted
